# Patient Record
Sex: Female | Born: 1970
Health system: Southern US, Community
[De-identification: ages and names within clinical notes are randomized; demographics above are authoritative.]

## PROBLEM LIST (undated history)

## (undated) DIAGNOSIS — I00 Rheumatic fever without heart involvement: Secondary | ICD-10-CM

## (undated) DIAGNOSIS — T7840XA Allergy, unspecified, initial encounter: Secondary | ICD-10-CM

## (undated) DIAGNOSIS — Z8669 Personal history of other diseases of the nervous system and sense organs: Secondary | ICD-10-CM

## (undated) DIAGNOSIS — R011 Cardiac murmur, unspecified: Secondary | ICD-10-CM

## (undated) DIAGNOSIS — B029 Zoster without complications: Secondary | ICD-10-CM

## (undated) HISTORY — DX: Cardiac murmur, unspecified: R01.1

## (undated) HISTORY — DX: Zoster without complications: B02.9

## (undated) HISTORY — DX: Allergy, unspecified, initial encounter: T78.40XA

## (undated) HISTORY — DX: Personal history of other diseases of the nervous system and sense organs: Z86.69

## (undated) HISTORY — DX: Rheumatic fever without heart involvement: I00

---

## 1996-08-02 HISTORY — PX: BREAST BIOPSY: SHX20

## 2007-08-03 HISTORY — PX: COLONOSCOPY: SHX174

## 2012-08-02 HISTORY — PX: TONSILLECTOMY: SUR1361

## 2013-08-02 HISTORY — PX: CHOLECYSTECTOMY: SHX55

## 2014-08-02 DIAGNOSIS — B029 Zoster without complications: Secondary | ICD-10-CM

## 2014-08-02 HISTORY — DX: Zoster without complications: B02.9

## 2016-02-25 DIAGNOSIS — J309 Allergic rhinitis, unspecified: Secondary | ICD-10-CM | POA: Diagnosis not present

## 2016-02-25 DIAGNOSIS — H66001 Acute suppurative otitis media without spontaneous rupture of ear drum, right ear: Secondary | ICD-10-CM | POA: Diagnosis not present

## 2016-02-25 DIAGNOSIS — H9201 Otalgia, right ear: Secondary | ICD-10-CM | POA: Diagnosis not present

## 2016-11-29 DIAGNOSIS — R202 Paresthesia of skin: Secondary | ICD-10-CM | POA: Diagnosis not present

## 2016-11-29 DIAGNOSIS — H4710 Unspecified papilledema: Secondary | ICD-10-CM | POA: Diagnosis not present

## 2016-11-29 DIAGNOSIS — G43019 Migraine without aura, intractable, without status migrainosus: Secondary | ICD-10-CM | POA: Diagnosis not present

## 2016-11-29 DIAGNOSIS — S060X0A Concussion without loss of consciousness, initial encounter: Secondary | ICD-10-CM | POA: Diagnosis not present

## 2016-11-29 DIAGNOSIS — R200 Anesthesia of skin: Secondary | ICD-10-CM | POA: Diagnosis not present

## 2016-11-30 ENCOUNTER — Other Ambulatory Visit: Payer: Self-pay | Admitting: Neurology

## 2016-11-30 DIAGNOSIS — R42 Dizziness and giddiness: Secondary | ICD-10-CM

## 2016-11-30 DIAGNOSIS — R413 Other amnesia: Secondary | ICD-10-CM

## 2016-11-30 DIAGNOSIS — S060X0A Concussion without loss of consciousness, initial encounter: Secondary | ICD-10-CM

## 2016-12-01 ENCOUNTER — Other Ambulatory Visit: Payer: Self-pay | Admitting: Neurology

## 2016-12-01 DIAGNOSIS — R413 Other amnesia: Secondary | ICD-10-CM

## 2016-12-01 DIAGNOSIS — S060X0A Concussion without loss of consciousness, initial encounter: Secondary | ICD-10-CM

## 2016-12-01 DIAGNOSIS — R42 Dizziness and giddiness: Secondary | ICD-10-CM

## 2016-12-01 DIAGNOSIS — G4459 Other complicated headache syndrome: Secondary | ICD-10-CM

## 2016-12-01 DIAGNOSIS — R2689 Other abnormalities of gait and mobility: Secondary | ICD-10-CM

## 2016-12-08 DIAGNOSIS — G43909 Migraine, unspecified, not intractable, without status migrainosus: Secondary | ICD-10-CM | POA: Insufficient documentation

## 2016-12-08 DIAGNOSIS — G43019 Migraine without aura, intractable, without status migrainosus: Secondary | ICD-10-CM | POA: Insufficient documentation

## 2016-12-10 ENCOUNTER — Ambulatory Visit: Payer: BLUE CROSS/BLUE SHIELD

## 2016-12-17 ENCOUNTER — Ambulatory Visit
Admission: RE | Admit: 2016-12-17 | Discharge: 2016-12-17 | Disposition: A | Discharge status: Home / Self Care | Payer: BLUE CROSS/BLUE SHIELD | Source: Ambulatory Visit | Attending: Neurology | Admitting: Neurology

## 2016-12-17 DIAGNOSIS — S060X0A Concussion without loss of consciousness, initial encounter: Secondary | ICD-10-CM

## 2016-12-17 DIAGNOSIS — R413 Other amnesia: Secondary | ICD-10-CM

## 2016-12-17 DIAGNOSIS — R2689 Other abnormalities of gait and mobility: Secondary | ICD-10-CM

## 2016-12-17 DIAGNOSIS — G4459 Other complicated headache syndrome: Secondary | ICD-10-CM

## 2016-12-17 DIAGNOSIS — R42 Dizziness and giddiness: Secondary | ICD-10-CM

## 2016-12-30 ENCOUNTER — Other Ambulatory Visit: Payer: Self-pay | Admitting: Neurology

## 2016-12-30 DIAGNOSIS — S060X0A Concussion without loss of consciousness, initial encounter: Secondary | ICD-10-CM

## 2016-12-31 DIAGNOSIS — R51 Headache: Secondary | ICD-10-CM | POA: Diagnosis not present

## 2017-01-09 ENCOUNTER — Ambulatory Visit
Admission: RE | Admit: 2017-01-09 | Discharge: 2017-01-09 | Disposition: A | Discharge status: Home / Self Care | Payer: BLUE CROSS/BLUE SHIELD | Source: Ambulatory Visit | Attending: Neurology | Admitting: Neurology

## 2017-01-09 DIAGNOSIS — S060X0A Concussion without loss of consciousness, initial encounter: Secondary | ICD-10-CM

## 2017-02-01 DIAGNOSIS — S060X9A Concussion with loss of consciousness of unspecified duration, initial encounter: Secondary | ICD-10-CM | POA: Diagnosis not present

## 2017-03-11 DIAGNOSIS — E559 Vitamin D deficiency, unspecified: Secondary | ICD-10-CM | POA: Diagnosis not present

## 2017-03-11 DIAGNOSIS — G43019 Migraine without aura, intractable, without status migrainosus: Secondary | ICD-10-CM | POA: Diagnosis not present

## 2017-03-11 DIAGNOSIS — R0683 Snoring: Secondary | ICD-10-CM | POA: Diagnosis not present

## 2017-03-11 DIAGNOSIS — S060X0D Concussion without loss of consciousness, subsequent encounter: Secondary | ICD-10-CM | POA: Diagnosis not present

## 2017-05-11 ENCOUNTER — Encounter: Payer: Self-pay | Admitting: Family Medicine

## 2017-05-11 ENCOUNTER — Ambulatory Visit (INDEPENDENT_AMBULATORY_CARE_PROVIDER_SITE_OTHER): Payer: BLUE CROSS/BLUE SHIELD | Admitting: Family Medicine

## 2017-05-11 VITALS — BP 126/84 | HR 81 | Temp 98.2°F | Ht 63.5 in | Wt 248.8 lb

## 2017-05-11 DIAGNOSIS — Z6841 Body Mass Index (BMI) 40.0 and over, adult: Secondary | ICD-10-CM | POA: Diagnosis not present

## 2017-05-11 DIAGNOSIS — J302 Other seasonal allergic rhinitis: Secondary | ICD-10-CM | POA: Diagnosis not present

## 2017-05-11 DIAGNOSIS — H9319 Tinnitus, unspecified ear: Secondary | ICD-10-CM | POA: Diagnosis not present

## 2017-05-11 DIAGNOSIS — Z7689 Persons encountering health services in other specified circumstances: Secondary | ICD-10-CM | POA: Diagnosis not present

## 2017-05-11 MED ORDER — FLUTICASONE PROPIONATE 50 MCG/ACT NA SUSP: 2.0000 | Freq: Every day | NASAL | 6 refills | Status: AC

## 2017-05-11 NOTE — Patient Instructions (Signed)
It was a pleasure to meet you today! I look forward to partnering with you for your health care needs  Please schedule a complete physical for the sprin

## 2017-05-11 NOTE — Progress Notes (Signed)
Subjective:    Patient ID: Melinda Tucker, female    DOB: October 21, 1970, 46 y.o.   MRN: [id number]  HPI This is a 46 yo female who presents today to establish care. She is married with 4 kids (all in their 55s). Her mother lives with her. 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 She is working on her doctorate in Neurosurgeon.   Has recently started a weight loss plan with exercise. Is walking. Is doing shakeology.   Energy level is good, sleeps well.   Had shingles in left eye two years ago. Doesn't get sick often. Gets migraines a couple of times of month.   Has had foot pain x 1 month, all over, when she stands up. Feels better when she wears shoes.   Had a concussion last year, had ringing in her ears which resolved but has returned. Has had some sinus issues with pressure under left eye. Takes zyrtec seasonally.   Had low calcium at 9.7. Did 50,000 IU weekly supplementation.    Past Medical History:  Diagnosis Date  . Allergy   . Heart murmur   . Hx of migraine headaches Started at age 60  . Rheumatic fever    At 15-years-ols  . Shingles 2016   Past Surgical History:  Procedure Laterality Date  . BREAST BIOPSY Left 1998  . CHOLECYSTECTOMY  2015  . COLONOSCOPY  2009  . TONSILLECTOMY  2014   Family History  Problem Relation Age of Onset  . Asthma Mother   . Depression Mother   . Hearing loss Mother   . Stroke Mother   . Arthritis Father   . Cancer Father   . Early death Father   . Hearing loss Father   . Hypertension Father   . Stroke Father   . Miscarriages / Stillbirths Sister   . Alcohol abuse Brother   . Mental illness Brother   . Learning disabilities Daughter   . Learning disabilities Son   . Arthritis Maternal Grandmother   . Cancer Maternal Grandmother   . Hearing loss Maternal Grandmother   . Heart attack Maternal Grandmother   . Kidney disease Maternal Grandmother   . Stroke Maternal  Grandmother   . Alcohol abuse Maternal Grandfather   . Early death Maternal Grandfather   . Alcohol abuse Paternal Grandmother   . Arthritis Paternal Grandmother   . Cancer Paternal Grandmother   . COPD Paternal Grandmother   . Early death Paternal Grandmother   . Heart disease Paternal Grandmother   . Stroke Paternal Grandmother   . Alcohol abuse Paternal Grandfather   . Arthritis Paternal Grandfather   . Cancer Paternal Grandfather   . Early death Paternal Grandfather   . Hypertension Paternal Grandfather    Social History  Substance Use Topics  . Smoking status: Not on file  . Smokeless tobacco: Not on file  . Alcohol use Not on file        Review of Systems  Constitutional: Negative for fatigue, fever and unexpected weight change.  Respiratory: Negative for cough, chest tightness and shortness of breath.   Cardiovascular: Negative for chest pain, palpitations and leg swelling.  Gastrointestinal: Negative for abdominal pain, constipation, diarrhea, nausea and vomiting.  Genitourinary: Negative for difficulty urinating and dysuria.  Musculoskeletal: Negative.   Allergic/Immunologic: Positive for environmental allergies (seasonal).  Neurological: Negative for dizziness, light-headedness and headaches.  Psychiatric/Behavioral: Negative for dysphoric mood and sleep disturbance. The  patient is not nervous/anxious.        Objective:   Physical Exam Physical Exam  Constitutional: Oriented to person, place, and time.She appears well-developed and well-nourished.  HENT:  Head: Normocephalic and atraumatic.  Eyes: Conjunctivae are normal.  Neck: Normal range of motion. Neck supple.  Cardiovascular: Normal rate, regular rhythm and normal heart sounds.   Pulmonary/Chest: Effort normal and breath sounds normal.  Musculoskeletal: Normal range of motion.  Neurological: Alert and oriented to person, place, and time.  Skin: Skin is warm and dry.  Psychiatric: Normal mood and  affect. Behavior is normal. Judgment and thought content normal.  Vitals reviewed.     BP 126/84 (BP Location: Right Arm, Patient Position: Sitting, Cuff Size: Large)   Pulse 81   Temp 98.2 F (36.8 C) (Oral)   Ht 5' 3.5" (1.613 m)   Wt 248 lb 12.8 oz (112.9 kg)   SpO2 97%   BMI 43.38 kg/m      Assessment & Plan:  1. Encounter to establish care - Discussed and encouraged healthy lifestyle choices- adequate sleep, regular exercise, stress management and healthy food choices.  - reviewed labs from 4/18 on Care Everywhere, no need for further labs today  2. Seasonal allergic rhinitis, unspecified trigger - fluticasone (FLONASE) 50 MCG/ACT nasal spray; Place 2 sprays into both nostrils daily.  Dispense: 16 g; Refill: 6  3. Class 3 severe obesity due to excess calories without serious comorbidity with body mass index (BMI) of 40.0 to 44.9 in adult [facility]) - encouraged continued weight loss efforts, regular exercise, healthy food choices  4. Tinnitus - if no improvement with treating allergic rhinitis, she will request follow up with neurology    Clarene Reamer, FNP-BC  [facility], [place]  05/16/2017 10:21 PM

## 2017-05-16 ENCOUNTER — Encounter: Payer: Self-pay | Admitting: Family Medicine

## 2017-11-11 ENCOUNTER — Ambulatory Visit (INDEPENDENT_AMBULATORY_CARE_PROVIDER_SITE_OTHER): Payer: BLUE CROSS/BLUE SHIELD | Admitting: Primary Care

## 2017-11-11 ENCOUNTER — Ambulatory Visit (INDEPENDENT_AMBULATORY_CARE_PROVIDER_SITE_OTHER)
Admission: RE | Admit: 2017-11-11 | Discharge: 2017-11-11 | Disposition: A | Discharge status: Home / Self Care | Payer: BLUE CROSS/BLUE SHIELD | Source: Ambulatory Visit | Attending: Primary Care | Admitting: Primary Care

## 2017-11-11 VITALS — BP 122/72 | HR 78 | Temp 98.0°F | Ht 63.5 in | Wt 241.0 lb

## 2017-11-11 DIAGNOSIS — Z0001 Encounter for general adult medical examination with abnormal findings: Secondary | ICD-10-CM

## 2017-11-11 DIAGNOSIS — Z1239 Encounter for other screening for malignant neoplasm of breast: Secondary | ICD-10-CM

## 2017-11-11 DIAGNOSIS — G8929 Other chronic pain: Secondary | ICD-10-CM | POA: Diagnosis not present

## 2017-11-11 DIAGNOSIS — G43019 Migraine without aura, intractable, without status migrainosus: Secondary | ICD-10-CM | POA: Diagnosis not present

## 2017-11-11 DIAGNOSIS — M79671 Pain in right foot: Secondary | ICD-10-CM

## 2017-11-11 DIAGNOSIS — D229 Melanocytic nevi, unspecified: Secondary | ICD-10-CM

## 2017-11-11 DIAGNOSIS — M7731 Calcaneal spur, right foot: Secondary | ICD-10-CM | POA: Diagnosis not present

## 2017-11-11 DIAGNOSIS — Z0000 Encounter for general adult medical examination without abnormal findings: Secondary | ICD-10-CM

## 2017-11-11 DIAGNOSIS — Z1231 Encounter for screening mammogram for malignant neoplasm of breast: Secondary | ICD-10-CM

## 2017-11-11 HISTORY — DX: Other chronic pain: G89.29

## 2017-11-11 LAB — COMPREHENSIVE METABOLIC PANEL
ALBUMIN: 4.2 g/dL (ref 3.5–5.2)
ALT: 26 U/L (ref 0–35)
AST: 19 U/L (ref 0–37)
Alkaline Phosphatase: 88 U/L (ref 39–117)
BILIRUBIN TOTAL: 0.5 mg/dL (ref 0.2–1.2)
BUN: 12 mg/dL (ref 6–23)
CALCIUM: 9.5 mg/dL (ref 8.4–10.5)
CHLORIDE: 104 meq/L (ref 96–112)
CO2: 29 mEq/L (ref 19–32)
CREATININE: 0.8 mg/dL (ref 0.40–1.20)
GFR: 81.16 mL/min (ref >60.00)
Glucose, Bld: 92 mg/dL (ref 70–99)
Potassium: 4.3 mEq/L (ref 3.5–5.1)
SODIUM: 139 meq/L (ref 135–145)
TOTAL PROTEIN: 7.5 g/dL (ref 6.0–8.3)

## 2017-11-11 LAB — LIPID PANEL
CHOLESTEROL: 172 mg/dL (ref 0–200)
HDL: 49.6 mg/dL (ref >39.00)
LDL Cholesterol: 101 mg/dL — ABNORMAL HIGH (ref 0–99)
NonHDL: 122
TRIGLYCERIDES: 106 mg/dL (ref 0.0–149.0)
Total CHOL/HDL Ratio: 3
VLDL: 21.2 mg/dL (ref 0.0–40.0)

## 2017-11-11 NOTE — Progress Notes (Signed)
Subjective:    Patient ID: Melinda Tucker, female    DOB: 1971/03/07, 47 y.o.   MRN: [id number]  HPI  Melinda Tucker is a 47 year old female who presents today for complete physical.  She's noticed foot pain that is located to the right lateral foot for the past 3-4 months. Her pain is daily, worse when standing after sitting for prolonged periods of time. She denies trauma/injury. She wears tennis shoes mostly. She's taken Motrin intermittently with improvement.   She is also concerned amount numerous nevi that have presented over the last several months. She has a red spot to her left cheek that doesn't look like any of the others. She does not see dermatology.   Immunizations: -Tetanus: Completed in 2012 -Influenza: Did not complete last season  Diet: She endorses a healthy diet. Breakfast: Oatmeal, protein bar Lunch: Salad Dinner: Meat, vegetables, starch Snacks: Nuts, dried fruit Desserts: 2-3 times weekly Beverages: Water, un-sweet tea, occasional alcohol  Exercise: She walks during lunch, 1 mile, 4 days weekly Eye exam: Completed 11 months ago. Dental exam: Completes annually  Pap Smear: Hysterectomy  Mammogram: Completed years ago    Review of Systems  Constitutional: Negative for unexpected weight change.  HENT: Negative for rhinorrhea.   Respiratory: Negative for cough and shortness of breath.   Cardiovascular: Negative for chest pain.  Gastrointestinal: Negative for constipation and diarrhea.  Genitourinary: Negative for difficulty urinating and menstrual problem.  Musculoskeletal: Positive for arthralgias. Negative for myalgias.       Right foot pain  Skin: Negative for rash.       Numerous nevi  Allergic/Immunologic: Negative for environmental allergies.  Neurological: Negative for dizziness, numbness and headaches.  Psychiatric/Behavioral: The patient is not nervous/anxious.        Past Medical History:  Diagnosis Date  . Allergy   . Heart murmur   . Hx of  migraine headaches Started at age 29  . Rheumatic fever    At 15-years-ols  . Shingles 2016     Social History   Socioeconomic History  . Marital status: Married    Spouse name: Not on file  . Number of children: Not on file  . Years of education: Not on file  . Highest education level: Not on file  Occupational History  . Not on file  Social Needs  . Financial resource strain: Not on file  . Food insecurity:    Worry: Not on file    Inability: Not on file  . Transportation needs:    Medical: Not on file    Non-medical: Not on file  Tobacco Use  . Smoking status: Never Smoker  . Smokeless tobacco: Never Used  Substance and Sexual Activity  . Alcohol use: Not on file  . Drug use: Not on file  . Sexual activity: Not on file  Lifestyle  . Physical activity:    Days per week: Not on file    Minutes per session: Not on file  . Stress: Not on file  Relationships  . Social connections:    Talks on phone: Not on file    Gets together: Not on file    Attends religious service: Not on file    Active member of club or organization: Not on file    Attends meetings of clubs or organizations: Not on file    Relationship status: Not on file  . Intimate partner violence:    Fear of current or ex partner: Not on file  Emotionally abused: Not on file    Physically abused: Not on file    Forced sexual activity: Not on file  Other Topics Concern  . Not on file  Social History Narrative  . Not on file     Family History  Problem Relation Age of Onset  . Asthma Mother   . Depression Mother   . Hearing loss Mother   . Stroke Mother   . Arthritis Father   . Cancer Father   . Early death Father   . Hearing loss Father   . Hypertension Father   . Stroke Father   . Miscarriages / Stillbirths Sister   . Alcohol abuse Brother   . Mental illness Brother   . Learning disabilities Daughter   . Learning disabilities Son   . Arthritis Maternal Grandmother   . Cancer Maternal  Grandmother   . Hearing loss Maternal Grandmother   . Heart attack Maternal Grandmother   . Kidney disease Maternal Grandmother   . Stroke Maternal Grandmother   . Alcohol abuse Maternal Grandfather   . Early death Maternal Grandfather   . Alcohol abuse Paternal Grandmother   . Arthritis Paternal Grandmother   . Cancer Paternal Grandmother   . COPD Paternal Grandmother   . Early death Paternal Grandmother   . Heart disease Paternal Grandmother   . Stroke Paternal Grandmother   . Alcohol abuse Paternal Grandfather   . Arthritis Paternal Grandfather   . Cancer Paternal Grandfather   . Early death Paternal Grandfather   . Hypertension Paternal Grandfather     Allergies  Allergen Reactions  . Codeine Anaphylaxis  . Shellfish Allergy Anaphylaxis  . Camphor Hives  . Phenol Hives  . Sulfa Antibiotics Rash    Current Outpatient Medications on File Prior to Visit  Medication Sig Dispense Refill  . fluticasone (FLONASE) 50 MCG/ACT nasal spray Place 2 sprays into both nostrils daily. 16 g 6  . rizatriptan (MAXALT) 10 MG tablet Take by mouth.     No current facility-administered medications on file prior to visit.     BP 122/72   Pulse 78   Temp 98 F (36.7 C) (Oral)   Ht 5' 3.5" (1.613 m)   Wt 241 lb (109.3 kg)   SpO2 98%   BMI 42.02 kg/m    Objective:   Physical Exam  Constitutional: She is oriented to person, place, and time. She appears well-nourished.  HENT:  Right Ear: Tympanic membrane and ear canal normal.  Left Ear: Tympanic membrane and ear canal normal.  Nose: Nose normal.  Mouth/Throat: Oropharynx is clear and moist.  Eyes: Pupils are equal, round, and reactive to light. Conjunctivae and EOM are normal.  Neck: Neck supple. No thyromegaly present.  Cardiovascular: Normal rate and regular rhythm.  No murmur heard. Pulmonary/Chest: Effort normal and breath sounds normal. She has no rales.  Abdominal: Soft. Bowel sounds are normal. There is no tenderness.    Musculoskeletal: Normal range of motion.       Right foot: There is tenderness. There is normal range of motion, no swelling and no deformity.       Feet:  Lymphadenopathy:    She has no cervical adenopathy.  Neurological: She is alert and oriented to person, place, and time. She has normal reflexes. No cranial nerve deficit.  Skin: Skin is warm and dry. No rash noted.  Numerous benign appearing nevi to upper extremities, anterior and posterior trunk. Small red circular lesion to left cheek.  Psychiatric: She  has a normal mood and affect.          Assessment & Plan:

## 2017-11-11 NOTE — Patient Instructions (Addendum)
Stop by the lab and xray prior to leaving today. I will notify you of your results once received.   Call the [facility] to schedule your mammogram.  Start exercising. You should be getting 150 minutes of moderate intensity exercise weekly.  Continue to work on a healthy diet.  Start Motrin 600 mg 2-3 times daily for one week for foot pain.  Ice the foot when possible. Try exercises and stretching daily for at least one week as discussed.  Stop by the front desk and speak with either Rosaria Ferries regarding your referral to Dermatology.   Follow up in 1 year for your annual exam or sooner if needed.  It was a pleasure to see you today!   Preventive Care 40-64 Years, Female Preventive care refers to lifestyle choices and visits with your health care provider that can promote health and wellness. What does preventive care include?  A yearly physical exam. This is also called an annual well check.  Dental exams once or twice a year.  Routine eye exams. Ask your health care provider how often you should have your eyes checked.  Personal lifestyle choices, including: ? Daily care of your teeth and gums. ? Regular physical activity. ? Eating a healthy diet. ? Avoiding tobacco and drug use. ? Limiting alcohol use. ? Practicing safe sex. ? Taking low-dose aspirin daily starting at age 44. ? Taking vitamin and mineral supplements as recommended by your health care provider. What happens during an annual well check? The services and screenings done by your health care provider during your annual well check will depend on your age, overall health, lifestyle risk factors, and family history of disease. Counseling Your health care provider may ask you questions about your:  Alcohol use.  Tobacco use.  Drug use.  Emotional well-being.  Home and relationship well-being.  Sexual activity.  Eating habits.  Work and work Statistician.  Method of birth control.  Menstrual  cycle.  Pregnancy history.  Screening You may have the following tests or measurements:  Height, weight, and BMI.  Blood pressure.  Lipid and cholesterol levels. These may be checked every 5 years, or more frequently if you are over 68 years old.  Skin check.  Lung cancer screening. You may have this screening every year starting at age 31 if you have a 30-pack-year history of smoking and currently smoke or have quit within the past 15 years.  Fecal occult blood test (FOBT) of the stool. You may have this test every year starting at age 28.  Flexible sigmoidoscopy or colonoscopy. You may have a sigmoidoscopy every 5 years or a colonoscopy every 10 years starting at age 16.  Hepatitis C blood test.  Hepatitis B blood test.  Sexually transmitted disease (STD) testing.  Diabetes screening. This is done by checking your blood sugar (glucose) after you have not eaten for a while (fasting). You may have this done every 1-3 years.  Mammogram. This may be done every 1-2 years. Talk to your health care provider about when you should start having regular mammograms. This may depend on whether you have a family history of breast cancer.  BRCA-related cancer screening. This may be done if you have a family history of breast, ovarian, tubal, or peritoneal cancers.  Pelvic exam and Pap test. This may be done every 3 years starting at age 61. Starting at age 92, this may be done every 5 years if you have a Pap test in combination with an HPV  test.  Bone density scan. This is done to screen for osteoporosis. You may have this scan if you are at high risk for osteoporosis.  Discuss your test results, treatment options, and if necessary, the need for more tests with your health care provider. Vaccines Your health care provider may recommend certain vaccines, such as:  Influenza vaccine. This is recommended every year.  Tetanus, diphtheria, and acellular pertussis (Tdap, Td) vaccine. You may  need a Td booster every 10 years.  Varicella vaccine. You may need this if you have not been vaccinated.  Zoster vaccine. You may need this after age 64.  Measles, mumps, and rubella (MMR) vaccine. You may need at least one dose of MMR if you were born in 1957 or later. You may also need a second dose.  Pneumococcal 13-valent conjugate (PCV13) vaccine. You may need this if you have certain conditions and were not previously vaccinated.  Pneumococcal polysaccharide (PPSV23) vaccine. You may need one or two doses if you smoke cigarettes or if you have certain conditions.  Meningococcal vaccine. You may need this if you have certain conditions.  Hepatitis A vaccine. You may need this if you have certain conditions or if you travel or work in places where you may be exposed to hepatitis A.  Hepatitis B vaccine. You may need this if you have certain conditions or if you travel or work in places where you may be exposed to hepatitis B.  Haemophilus influenzae type b (Hib) vaccine. You may need this if you have certain conditions.  Talk to your health care provider about which screenings and vaccines you need and how often you need them. This information is not intended to replace advice given to you by your health care provider. Make sure you discuss any questions you have with your health care provider. Document Released: 08/15/2015 Document Revised: 04/07/2016 Document Reviewed: 05/20/2015 Elsevier Interactive Patient Education  [employer].

## 2017-11-11 NOTE — Assessment & Plan Note (Signed)
Widespread to upper extremities, bilateral, also to anterior and posterior trunk. Most appear benign. Questionable lesion to left cheek. Referral placed to dermatology for further evaluation.

## 2017-11-11 NOTE — Assessment & Plan Note (Signed)
To right medial foot x 3-4 months. Suspect this is secondary to strain from renovation of her home. Recommended stretching/exercises, rest, Motrin daily x 1 week. Check plain films today. No obvious deformity.

## 2017-11-11 NOTE — Assessment & Plan Note (Signed)
Td UTD. Mammogram due, orders placed. Recommended regular exercise, continue to work on diet. Exam unremarkable. Labs pending. Follow up in 1 year for CPE.

## 2017-11-11 NOTE — Assessment & Plan Note (Signed)
Well managed. Doing Flonase to prevent migraines. Using Maxalt once every 6 weeks on average.

## 2017-11-14 ENCOUNTER — Other Ambulatory Visit: Payer: Self-pay | Admitting: Primary Care

## 2017-11-14 ENCOUNTER — Encounter: Payer: Self-pay | Admitting: *Deleted

## 2017-11-14 DIAGNOSIS — M7731 Calcaneal spur, right foot: Secondary | ICD-10-CM

## 2017-12-06 ENCOUNTER — Ambulatory Visit: Payer: BLUE CROSS/BLUE SHIELD | Admitting: Podiatry

## 2017-12-06 ENCOUNTER — Encounter: Payer: Self-pay | Admitting: Podiatry

## 2017-12-06 DIAGNOSIS — G5763 Lesion of plantar nerve, bilateral lower limbs: Secondary | ICD-10-CM | POA: Diagnosis not present

## 2017-12-06 DIAGNOSIS — M7751 Other enthesopathy of right foot: Secondary | ICD-10-CM

## 2017-12-06 MED ORDER — MELOXICAM 15 MG PO TABS: 15.0000 mg | ORAL_TABLET | Freq: Every day | ORAL | 1 refills | Status: DC

## 2017-12-08 NOTE — Progress Notes (Signed)
   HPI: 47 year old female presenting today as a new patient with a chief complaint of pain to the arch and 5th metatarsal of the right foot that began 6 months ago. Walking increases this pain. She has not done anything to treat the symptoms. Patient is here for further evaluation and treatment.   Past Medical History:  Diagnosis Date  . Allergy   . Heart murmur   . Hx of migraine headaches Started at age 43  . Rheumatic fever    At 15-years-ols  . Shingles 2016     Physical Exam: General: The patient is alert and oriented x3 in no acute distress.  Dermatology: Skin is warm, dry and supple bilateral lower extremities. Negative for open lesions or macerations.  Vascular: Palpable pedal pulses bilaterally. No edema or erythema noted. Capillary refill within normal limits.  Neurological: Epicritic and protective threshold grossly intact bilaterally.   Musculoskeletal Exam: Sharp pain with palpation of the 4th interspace and lateral compression of the metatarsal heads consistent with neuroma.  Positive Conley Canal sign with loadbearing of the forefoot. Pain with palpation to the 5th MPJ of the right foot.   Assessment: 1.  Morton's neuroma 4th interspace bilateral feet, right greater than left 2. 5th MPJ capsulitis right    Plan of Care:  1. Patient was evaluated. X-Ray in Epic reviewed.  2. Injection of 0.5 mLs Celestone Soluspan injected into the Morton's neuroma of the right foot.  3. Injection of 0.5 mLs Celestone Soluspan injected into the 5th MPJ of the right foot.  4. Prescription for Meloxicam provided to patient.  5. Met pads dispensed.  6. Return to clinic in 4 weeks.    Edrick Kins, DPM [facility]  Dr. Edrick Kins, [address]                                        [place], [place] [address]                Office [phone]  Fax [phone]

## 2017-12-12 ENCOUNTER — Encounter: Payer: Self-pay | Admitting: Podiatry

## 2017-12-21 DIAGNOSIS — D2262 Melanocytic nevi of left upper limb, including shoulder: Secondary | ICD-10-CM | POA: Diagnosis not present

## 2017-12-21 DIAGNOSIS — Z1283 Encounter for screening for malignant neoplasm of skin: Secondary | ICD-10-CM | POA: Diagnosis not present

## 2017-12-21 DIAGNOSIS — D485 Neoplasm of uncertain behavior of skin: Secondary | ICD-10-CM | POA: Diagnosis not present

## 2017-12-21 DIAGNOSIS — L812 Freckles: Secondary | ICD-10-CM | POA: Diagnosis not present

## 2017-12-21 DIAGNOSIS — B078 Other viral warts: Secondary | ICD-10-CM | POA: Diagnosis not present

## 2017-12-21 DIAGNOSIS — D225 Melanocytic nevi of trunk: Secondary | ICD-10-CM | POA: Diagnosis not present

## 2017-12-21 DIAGNOSIS — L918 Other hypertrophic disorders of the skin: Secondary | ICD-10-CM | POA: Diagnosis not present

## 2017-12-21 DIAGNOSIS — L820 Inflamed seborrheic keratosis: Secondary | ICD-10-CM | POA: Diagnosis not present

## 2018-01-03 ENCOUNTER — Encounter: Payer: Self-pay | Admitting: Podiatry

## 2018-01-03 ENCOUNTER — Ambulatory Visit: Payer: BLUE CROSS/BLUE SHIELD | Admitting: Podiatry

## 2018-01-03 DIAGNOSIS — M7751 Other enthesopathy of right foot: Secondary | ICD-10-CM

## 2018-01-03 DIAGNOSIS — G5763 Lesion of plantar nerve, bilateral lower limbs: Secondary | ICD-10-CM

## 2018-01-03 DIAGNOSIS — M779 Enthesopathy, unspecified: Secondary | ICD-10-CM | POA: Diagnosis not present

## 2018-01-05 NOTE — Progress Notes (Signed)
   HPI: 46 year old female presenting today for follow up evaluation of a Morton's neuroma of bilateral feet and 5th MPJ capsulitis of the right foot. She states her pain has improved significantly. She reports some mild numbness but denies any pain. There are no modifying factors noted. Patient is here for further evaluation and treatment.   Past Medical History:  Diagnosis Date  . Allergy   . Heart murmur   . Hx of migraine headaches Started at age 38  . Rheumatic fever    At 15-years-ols  . Shingles 2016     Objective: Physical Exam General: The patient is alert and oriented x3 in no acute distress.  Dermatology: Skin is cool, dry and supple bilateral lower extremities. Negative for open lesions or macerations.  Vascular: Palpable pedal pulses bilaterally. No edema or erythema noted. Capillary refill within normal limits.  Neurological: Epicritic and protective threshold grossly intact bilaterally.   Musculoskeletal Exam: All pedal and ankle joints range of motion within normal limits bilateral. Muscle strength 5/5 in all groups bilateral.    Assessment: 1. Morton's neuroma 4th interspace bilateral feet, right greater than left - resolved 2. 5th MPJ capsulitis right - resolved    Plan of Care:  1. Patient was evaluated.  2. Recommended good shoe gear.  3. Discontinue taking Meloxicam due to side effects.  4. Return to clinic as needed.     Edrick Kins, DPM [facility]  Dr. Edrick Kins, [place]                                        [place], [place] [address]                Office [phone]  Fax [phone]

## 2018-01-10 ENCOUNTER — Telehealth: Payer: BLUE CROSS/BLUE SHIELD | Admitting: Nurse Practitioner

## 2018-01-10 DIAGNOSIS — R059 Cough, unspecified: Secondary | ICD-10-CM

## 2018-01-10 DIAGNOSIS — R05 Cough: Secondary | ICD-10-CM

## 2018-01-10 MED ORDER — BENZONATATE 100 MG PO CAPS: 100.0000 mg | ORAL_CAPSULE | Freq: Three times a day (TID) | ORAL | 0 refills | Status: DC | PRN

## 2018-01-10 NOTE — Progress Notes (Signed)

## 2018-01-12 MED ORDER — PREDNISONE 10 MG (21) PO TBPK
ORAL_TABLET | ORAL | 0 refills | Status: DC
Start: 2018-01-12 — End: 2018-02-17

## 2018-01-12 NOTE — Addendum Note (Signed)
Addended by: Chevis Pretty on: 01/12/2018 05:02 PM   Modules accepted: Orders

## 2018-01-16 ENCOUNTER — Ambulatory Visit: Payer: Self-pay | Admitting: *Deleted

## 2018-01-16 DIAGNOSIS — J22 Unspecified acute lower respiratory infection: Secondary | ICD-10-CM | POA: Diagnosis not present

## 2018-01-16 DIAGNOSIS — R05 Cough: Secondary | ICD-10-CM | POA: Diagnosis not present

## 2018-01-16 NOTE — Telephone Encounter (Signed)
Pt reports productive cough, onset 01/11/18. Coughing up "Yellowish mucous." Audible wheezing, inspiratory and expiratory. States SOB with coughing. Did E-Visit 6/13, "Viral bronchitis" and started on prednisone, day 5 of 6. Also RX Tessalon perles, ineffective. Reports intermittent fever, Max 101.4 Saturday, has not checked temp, this AM. States cough worsening, can not lie down, awake most of night. Also reports "I usually have episode of bronchitis once a year but this seems different."  Pt directed to UC. States will follow disposition. Care advise given per protocol. Reason for Disposition . Wheezing is present  Answer Assessment - Initial Assessment Questions 1. ONSET: "When did the cough begin?"      01/11/18 2. SEVERITY: "How bad is the cough today?"      severe 3. RESPIRATORY DISTRESS: "Describe your breathing."      SOB with coughing, wheezing present.  Audible during call. 4. FEVER: "Do you have a fever?" If so, ask: "What is your temperature, how was it measured, and when did it start?"     At times , MAX 101.4, Saturday 5. SPUTUM: "Describe the color of your sputum" (clear, white, yellow, green)     Yellowish 6. HEMOPTYSIS: "Are you coughing up any blood?" If so ask: "How much?" (flecks, streaks, tablespoons, etc.)     no 7. CARDIAC HISTORY: "Do you have any history of heart disease?" (e.g., heart attack, congestive heart failure)      no 8. LUNG HISTORY: "Do you have any history of lung disease?"  (e.g., pulmonary embolus, asthma, emphysema)     no 9. PE RISK FACTORS: "Do you have a history of blood clots?" (or: recent major surgery, recent prolonged travel, bedridden )     no 10. OTHER SYMPTOMS: "Do you have any other symptoms?" (e.g., runny nose, wheezing, chest pain) Fever, achy at times  Protocols used: [place]

## 2018-01-16 NOTE — Telephone Encounter (Signed)
Patient called back. Patient did went to urgent care, she was there all day. She was diagnosis with bacterial pneumonia. Patient is at the pharmacy now and picking up her Rx. Patient will update later.

## 2018-01-16 NOTE — Telephone Encounter (Signed)
Noted.  Please follow up with patient, did she go to Urgent Care?  What was the diagnosis? If she didn't go to UC then I'm happy to see her.

## 2018-01-16 NOTE — Telephone Encounter (Signed)
Message left for patient to return my call.  

## 2018-01-16 NOTE — Telephone Encounter (Signed)
Noted  

## 2018-02-08 ENCOUNTER — Telehealth: Payer: Self-pay | Admitting: *Deleted

## 2018-02-08 NOTE — Telephone Encounter (Signed)
Refill request for meloxicam. Reviewed LOV and Meloxicam had been discontinued due to side effects. Return fax denying.

## 2018-02-12 ENCOUNTER — Other Ambulatory Visit: Payer: Self-pay | Admitting: Nurse Practitioner

## 2018-02-13 ENCOUNTER — Encounter: Payer: Self-pay | Admitting: Primary Care

## 2018-02-15 MED ORDER — BENZONATATE 100 MG PO CAPS: 100.0000 mg | ORAL_CAPSULE | Freq: Three times a day (TID) | ORAL | 0 refills | Status: DC | PRN

## 2018-02-17 ENCOUNTER — Ambulatory Visit: Payer: BLUE CROSS/BLUE SHIELD | Admitting: Primary Care

## 2018-02-17 ENCOUNTER — Encounter: Payer: Self-pay | Admitting: Primary Care

## 2018-02-17 VITALS — BP 120/70 | HR 91 | Temp 98.2°F | Ht 63.5 in | Wt 242.2 lb

## 2018-02-17 DIAGNOSIS — R059 Cough, unspecified: Secondary | ICD-10-CM

## 2018-02-17 DIAGNOSIS — R05 Cough: Secondary | ICD-10-CM

## 2018-02-17 MED ORDER — BENZONATATE 200 MG PO CAPS: 200.0000 mg | ORAL_CAPSULE | Freq: Three times a day (TID) | ORAL | 0 refills | Status: DC | PRN

## 2018-02-17 NOTE — Progress Notes (Signed)
Subjective:    Patient ID: Melinda Tucker, female    DOB: 1970-10-18, 47 y.o.   MRN: [id number]  HPI  Melinda Tucker is a 47 year old female who presents today with a chief complaint of cough.   Her cough began 7 weeks ago with initial symptoms of fatigue, cough, fevers, headaches.  Her cough began June 8th, 2019. She was evaluated through an e-visit on 01/10/18 and was diagnosed with viral bronchitis. She was treated with Tessalon Perles 100 mg. She then reached back out to the treating provider on 01/12/18 for continued cough and was then treated with prednisone course for continued cough.  She then presented to [facility] through [facility] on 01/16/18. She underwent CBC which showed leukocytosis, also underwent chest xray which was negative for pneumonia. She was treated with azithromycin, Bromfed DM, albuterol inhaler and told to follow up if no improvement.   She sent a message through our online portal on 02/13/18 with reports of continued cough that was productive.   Now she's feeling less fatigued but is still napping once daily. Her cough is overall better, less frequent, but continues to experience coughing spells. She also reports shortness of breath with exertion, wheezing. She has to take the elevated at work rather than using the stairs. She's not checked her temperature recently.   She's using her albuterol inhaler once daily with temporary improvement in shortness of breath. She's taken Zyrtec intermittently for allergy symptoms. She was provided with a refill for Tessalon Perles 100 mg for which she's not yet picked up.   She denies a history of asthma, esophageal burning/belching/reflux.   Review of Systems  Constitutional: Positive for fatigue. Negative for chills.  HENT: Positive for postnasal drip. Negative for congestion and sore throat.   Respiratory: Positive for cough, shortness of breath and wheezing.   Cardiovascular: Negative for chest pain.    Allergic/Immunologic: Positive for environmental allergies.       Past Medical History:  Diagnosis Date  . Allergy   . Heart murmur   . Hx of migraine headaches Started at age 8  . Rheumatic fever    At 15-years-ols  . Shingles 2016     Social History   Socioeconomic History  . Marital status: Married    Spouse name: Not on file  . Number of children: Not on file  . Years of education: Not on file  . Highest education level: Not on file  Occupational History  . Not on file  Social Needs  . Financial resource strain: Not on file  . Food insecurity:    Worry: Not on file    Inability: Not on file  . Transportation needs:    Medical: Not on file    Non-medical: Not on file  Tobacco Use  . Smoking status: Never Smoker  . Smokeless tobacco: Never Used  Substance and Sexual Activity  . Alcohol use: Not on file  . Drug use: Not on file  . Sexual activity: Not on file  Lifestyle  . Physical activity:    Days per week: Not on file    Minutes per session: Not on file  . Stress: Not on file  Relationships  . Social connections:    Talks on phone: Not on file    Gets together: Not on file    Attends religious service: Not on file    Active member of club or organization: Not on file    Attends meetings of clubs or  organizations: Not on file    Relationship status: Not on file  . Intimate partner violence:    Fear of current or ex partner: Not on file    Emotionally abused: Not on file    Physically abused: Not on file    Forced sexual activity: Not on file  Other Topics Concern  . Not on file  Social History Narrative  . Not on file     Family History  Problem Relation Age of Onset  . Asthma Mother   . Depression Mother   . Hearing loss Mother   . Stroke Mother   . Arthritis Father   . Cancer Father   . Early death Father   . Hearing loss Father   . Hypertension Father   . Stroke Father   . Miscarriages / Stillbirths Sister   . Alcohol abuse Brother    . Mental illness Brother   . Learning disabilities Daughter   . Learning disabilities Son   . Arthritis Maternal Grandmother   . Cancer Maternal Grandmother   . Hearing loss Maternal Grandmother   . Heart attack Maternal Grandmother   . Kidney disease Maternal Grandmother   . Stroke Maternal Grandmother   . Alcohol abuse Maternal Grandfather   . Early death Maternal Grandfather   . Alcohol abuse Paternal Grandmother   . Arthritis Paternal Grandmother   . Cancer Paternal Grandmother   . COPD Paternal Grandmother   . Early death Paternal Grandmother   . Heart disease Paternal Grandmother   . Stroke Paternal Grandmother   . Alcohol abuse Paternal Grandfather   . Arthritis Paternal Grandfather   . Cancer Paternal Grandfather   . Early death Paternal Grandfather   . Hypertension Paternal Grandfather     Allergies  Allergen Reactions  . Codeine Anaphylaxis  . Shellfish Allergy Anaphylaxis  . Camphor Hives  . Meloxicam Other (See Comments)  . Phenol Hives  . Sulfa Antibiotics Rash    Current Outpatient Medications on File Prior to Visit  Medication Sig Dispense Refill  . meloxicam (MOBIC) 15 MG tablet Take 1 tablet (15 mg total) by mouth daily. 30 tablet 1  . rizatriptan (MAXALT) 10 MG tablet Take by mouth.    . fluticasone (FLONASE) 50 MCG/ACT nasal spray Place 2 sprays into both nostrils daily. (Patient not taking: Reported on 02/17/2018) 16 g 6   No current facility-administered medications on file prior to visit.     BP 120/70   Pulse 91   Temp 98.2 F (36.8 C) (Oral)   Ht 5' 3.5" (1.613 m)   Wt 242 lb 4 oz (109.9 kg)   SpO2 95%   BMI 42.24 kg/m    Objective:   Physical Exam  Constitutional: She appears well-nourished. She does not appear ill.  HENT:  Right Ear: Tympanic membrane and ear canal normal.  Left Ear: Tympanic membrane and ear canal normal.  Nose: No mucosal edema. Right sinus exhibits no maxillary sinus tenderness and no frontal sinus  tenderness. Left sinus exhibits no maxillary sinus tenderness and no frontal sinus tenderness.  Mouth/Throat: Oropharynx is clear and moist.  Neck: Neck supple.  Cardiovascular: Normal rate and regular rhythm.  Respiratory: Effort normal and breath sounds normal. She has no wheezes.  Deep congested cough noted during visit  Skin: Skin is warm and dry.           Assessment & Plan:  Post Infectious Cough:  Treated for bronchitis with antibiotics, cough overall better, persists. Exam today  with clear lungs, doesn't appear sickly, normal vitals. Suspect post infectious cough given exam and lack of other symptoms. Discussed nature of post infectious cough. Will treat with Tessalon Perles 200 mg, Zyrtec daily, albuterol PRN. She will update in 1 week.  Pleas Koch, NP

## 2018-02-17 NOTE — Patient Instructions (Signed)
You may take Benzonatate capsules for cough. Take 1 capsule by mouth three times daily as needed for cough.  Start Zyrtec daily for allergies.  Use the albuterol inhaler as needed for wheezing/shortness of breath.   Slowly increase activity levels.   Please message me in 1 week if no improvement.  It was a pleasure to see you today!

## 2018-04-24 ENCOUNTER — Telehealth: Payer: Self-pay

## 2018-04-24 NOTE — Telephone Encounter (Signed)
PLEASE NOTE: All timestamps contained within this report are represented as [place] Standard Time. CONFIDENTIALTY NOTICE: This fax transmission is intended only for the addressee. It contains information that is legally privileged, confidential or otherwise protected from use or disclosure. If you are not the intended recipient, you are strictly prohibited from reviewing, disclosing, copying using or disseminating any of this information or taking any action in reliance on or regarding this information. If you have received this fax in error, please notify us immediately by telephone so that we can arrange for its return to [place]. Phone: [phone], Toll-Free: [phone], Fax: [phone] Page: 1 of 2 Call Id: [id number] [place] Patient Name: Melinda Tucker Gender: Female DOB: [birth year] Age: 47 Y 18 D Return Phone Number: [phone] (Primary), [phone] (Secondary) [place] [address] Client [place] Night - Client Client Site [facility] [place] Physician Alma Friendly - NP Contact Type Call Who Is Calling Patient / Member / Family / Caregiver Call Type Triage / Clinical Relationship To Patient Self Return Phone Number [phone] (Primary) Chief Complaint Toe Pain Reason for Call Symptomatic / Request for [place] says that she took a nap, and when she woke up her big toe is really swollen and paniful Translation No Nurse Assessment Nurse: Thad Ranger, RN, Langley Gauss Date/Time (Eastern Time): 04/23/2018 9:47:40 PM Confirm and document reason for call. If symptomatic, describe symptoms. ---Caller says that she took a nap, and when she woke up her big toe is really red, swollen, and painful. Pedicure 2 days ago and the entire toe is red/swollen. Does the patient have any new or  worsening symptoms? ---Yes Will a triage be completed? ---Yes Related visit to physician within the last 2 weeks? ---No Does the PT have any chronic conditions? (i.e. diabetes, asthma, this includes High risk factors for pregnancy, etc.) ---No Is the patient pregnant or possibly pregnant? (Ask all females between the ages of 32-55) ---No Is this a behavioral health or substance abuse call? ---No Guidelines Guideline Title Affirmed Question Affirmed Notes Nurse Date/Time (Eastern Time) Toe Pain [1] SEVERE pain (e.g., excruciating, unable to do any normal activities) AND [2] not improved after 2 hours of pain medicine Carmon, RN, Langley Gauss 04/23/2018 9:48:35 PM Disp. Time Eilene Ghazi Time) Disposition Final User 04/23/2018 8:45:20 PM Send To RN Personal Vira Agar, RN, Seth Bake 04/23/2018 9:50:57 PM See HCP within 4 Hours (or PCP triage) Yes Carmon, RN, Langley Gauss PLEASE NOTE: All timestamps contained within this report are represented as [place] Standard Time. CONFIDENTIALTY NOTICE: This fax transmission is intended only for the addressee. It contains information that is legally privileged, confidential or otherwise protected from use or disclosure. If you are not the intended recipient, you are strictly prohibited from reviewing, disclosing, copying using or disseminating any of this information or taking any action in reliance on or regarding this information. If you have received this fax in error, please notify us immediately by telephone so that we can arrange for its return to [place]. Phone: [phone], Toll-Free: [phone], Fax: [phone] Page: 2 of 2 Call Id: [id number] [place] Disagree/Comply Comply Caller Understands Yes PreDisposition Go to ED Care Advice Given Per Guideline SEE HCP WITHIN 4 HOURS (OR PCP TRIAGE): PAIN MEDICINES: ACETAMINOPHEN (E.G., TYLENOL): * Another choice is to take 1,000 mg (two 500 mg pills) every 8 hours as needed. Each Extra Strength Tylenol pill has 500 mg  of  acetaminophen. The most you should take each day is 3,000 mg (6 Extra Strength pills a day). IBUPROFEN (E.G., MOTRIN, ADVIL): * Take 400 mg (two 200 mg pills) by mouth every 6 hours as needed. * Another choice is to take 600 mg (three 200 mg pills) by mouth every 8 hours as needed. CALL BACK IF: * You become worse. CARE ADVICE given per Toe Pain (Adult) guideline. Comments User: Romeo Apple, RN Date/Time Eilene Ghazi Time): 04/23/2018 9:50:36 PM Advised to keep foot elevated above heart. Referrals [facility] - ED

## 2018-04-24 NOTE — Telephone Encounter (Signed)
Unable to reach pt by phone for update. 

## 2018-04-25 NOTE — Telephone Encounter (Signed)
Noted  

## 2018-04-25 NOTE — Telephone Encounter (Signed)
FYI. Spoken to patient. She stated that she went to urgent care yesterday afternoon. Received Rx for an infection in her toe.

## 2018-05-30 ENCOUNTER — Other Ambulatory Visit: Payer: Self-pay | Admitting: Orthopedic Surgery

## 2018-05-30 DIAGNOSIS — G8929 Other chronic pain: Secondary | ICD-10-CM | POA: Diagnosis not present

## 2018-05-30 DIAGNOSIS — M25512 Pain in left shoulder: Principal | ICD-10-CM

## 2018-06-05 ENCOUNTER — Other Ambulatory Visit: Payer: Self-pay | Admitting: Orthopedic Surgery

## 2018-06-17 ENCOUNTER — Ambulatory Visit
Admission: RE | Admit: 2018-06-17 | Discharge: 2018-06-17 | Disposition: A | Discharge status: Home / Self Care | Payer: BLUE CROSS/BLUE SHIELD | Source: Ambulatory Visit | Attending: Orthopedic Surgery | Admitting: Orthopedic Surgery

## 2018-06-17 DIAGNOSIS — G8929 Other chronic pain: Secondary | ICD-10-CM

## 2018-06-17 DIAGNOSIS — M25512 Pain in left shoulder: Principal | ICD-10-CM

## 2018-06-17 DIAGNOSIS — R200 Anesthesia of skin: Secondary | ICD-10-CM | POA: Diagnosis not present

## 2018-06-21 DIAGNOSIS — M25512 Pain in left shoulder: Secondary | ICD-10-CM | POA: Diagnosis not present

## 2018-06-21 DIAGNOSIS — M7502 Adhesive capsulitis of left shoulder: Secondary | ICD-10-CM | POA: Diagnosis not present

## 2018-06-21 DIAGNOSIS — G8929 Other chronic pain: Secondary | ICD-10-CM | POA: Diagnosis not present

## 2018-11-17 ENCOUNTER — Encounter: Payer: Self-pay | Admitting: Primary Care

## 2018-11-17 ENCOUNTER — Other Ambulatory Visit: Payer: Self-pay

## 2018-11-17 ENCOUNTER — Ambulatory Visit (INDEPENDENT_AMBULATORY_CARE_PROVIDER_SITE_OTHER): Payer: BLUE CROSS/BLUE SHIELD | Admitting: Primary Care

## 2018-11-17 DIAGNOSIS — L237 Allergic contact dermatitis due to plants, except food: Secondary | ICD-10-CM | POA: Insufficient documentation

## 2018-11-17 HISTORY — DX: Allergic contact dermatitis due to plants, except food: L23.7

## 2018-11-17 MED ORDER — TRIAMCINOLONE ACETONIDE 0.1 % EX CREA: 1.0000 "application " | TOPICAL_CREAM | Freq: Two times a day (BID) | CUTANEOUS | 0 refills | Status: DC

## 2018-11-17 MED ORDER — PREDNISONE 20 MG PO TABS: ORAL_TABLET | ORAL | 0 refills | Status: DC

## 2018-11-17 NOTE — Assessment & Plan Note (Signed)
Consistent for poison ivy dermatitis via video visit. Rx for prednisone taper and triamcinolone cream sent to pharmacy. She will update if symptoms persist despite treatment.

## 2018-11-17 NOTE — Patient Instructions (Signed)
Start Prednisone 20 mg tablets. Take 2 tablets for four days, then 1 tablet for four days.   Use the triamcinolone cream twice daily as needed for itching/rash.  It was a pleasure to see you today!

## 2018-11-17 NOTE — Progress Notes (Signed)
Subjective:    Patient ID: Melinda Tucker, female    DOB: [birth year], 48 y.o.   MRN: [id number]  HPI  Virtual Visit via Video Note  I connected with Carollee Herter on 11/17/18 at  4:00 PM EDT by a video enabled telemedicine application and verified that I am speaking with the correct person using two identifiers.   I discussed the limitations of evaluation and management by telemedicine and the availability of in person appointments. The patient expressed understanding and agreed to proceed. She is at home, I am in the office.  History of Present Illness:  Melinda Tucker is a 48 year old female who presents today via video with a chief complaint of rash.  She's been out in her yard intermittently for the last several weeks, and yesterday she was in her yard cutting down bushes and encounter poison ivy. She noticed itching to her bilateral lower extremities last night when showering and then noticed rashes to her body this morning. Her rashes are located to the left lateral stomach, mid thoracic back, and left popliteal fossa. Her rash is very itchy. She's tried an OTC poison ivy spray without improvement.    Observations/Objective:  No distress. Speaking in complete sentences. Rashes with erythema, raised small bumps to mid thoracic back, left popliteal fossa, and left lower abdomen.   Assessment and Plan:  Consistent for poison ivy dermatitis via video visit. Rx for prednisone taper and triamcinolone cream sent to pharmacy. She will update if symptoms persist despite treatment.  Follow Up Instructions:  Start Prednisone 20 mg tablets. Take 2 tablets for four days, then 1 tablet for four days.   Use the triamcinolone cream twice daily as needed for itching/rash.  It was a pleasure to see you today!    I discussed the assessment and treatment plan with the patient. The patient was provided an opportunity to ask questions and all were answered. The patient agreed with the plan and  demonstrated an understanding of the instructions.   The patient was advised to call back or seek an in-person evaluation if the symptoms worsen or if the condition fails to improve as anticipated.     Pleas Koch, NP    Review of Systems  Constitutional: Negative for fever.  Skin: Positive for rash.       Past Medical History:  Diagnosis Date  . Allergy   . Heart murmur   . Hx of migraine headaches Started at age 19  . Rheumatic fever    At 15-years-ols  . Shingles 2016     Social History   Socioeconomic History  . Marital status: Married    Spouse name: Not on file  . Number of children: Not on file  . Years of education: Not on file  . Highest education level: Not on file  Occupational History  . Not on file  Social Needs  . Financial resource strain: Not on file  . Food insecurity:    Worry: Not on file    Inability: Not on file  . Transportation needs:    Medical: Not on file    Non-medical: Not on file  Tobacco Use  . Smoking status: Never Smoker  . Smokeless tobacco: Never Used  Substance and Sexual Activity  . Alcohol use: Not on file  . Drug use: Not on file  . Sexual activity: Not on file  Lifestyle  . Physical activity:    Days per week: Not on file  Minutes per session: Not on file  . Stress: Not on file  Relationships  . Social connections:    Talks on phone: Not on file    Gets together: Not on file    Attends religious service: Not on file    Active member of club or organization: Not on file    Attends meetings of clubs or organizations: Not on file    Relationship status: Not on file  . Intimate partner violence:    Fear of current or ex partner: Not on file    Emotionally abused: Not on file    Physically abused: Not on file    Forced sexual activity: Not on file  Other Topics Concern  . Not on file  Social History Narrative  . Not on file    Past Surgical History:  Procedure Laterality Date  . BREAST BIOPSY Left  1998  . CHOLECYSTECTOMY  2015  . COLONOSCOPY  2009  . TONSILLECTOMY  2014    Family History  Problem Relation Age of Onset  . Asthma Mother   . Depression Mother   . Hearing loss Mother   . Stroke Mother   . Arthritis Father   . Cancer Father   . Early death Father   . Hearing loss Father   . Hypertension Father   . Stroke Father   . Miscarriages / Stillbirths Sister   . Alcohol abuse Brother   . Mental illness Brother   . Learning disabilities Daughter   . Learning disabilities Son   . Arthritis Maternal Grandmother   . Cancer Maternal Grandmother   . Hearing loss Maternal Grandmother   . Heart attack Maternal Grandmother   . Kidney disease Maternal Grandmother   . Stroke Maternal Grandmother   . Alcohol abuse Maternal Grandfather   . Early death Maternal Grandfather   . Alcohol abuse Paternal Grandmother   . Arthritis Paternal Grandmother   . Cancer Paternal Grandmother   . COPD Paternal Grandmother   . Early death Paternal Grandmother   . Heart disease Paternal Grandmother   . Stroke Paternal Grandmother   . Alcohol abuse Paternal Grandfather   . Arthritis Paternal Grandfather   . Cancer Paternal Grandfather   . Early death Paternal Grandfather   . Hypertension Paternal Grandfather     Allergies  Allergen Reactions  . Codeine Anaphylaxis  . Shellfish Allergy Anaphylaxis  . Camphor Hives  . Meloxicam Other (See Comments)  . Phenol Hives  . Sulfa Antibiotics Rash    Current Outpatient Medications on File Prior to Visit  Medication Sig Dispense Refill  . meloxicam (MOBIC) 15 MG tablet Take 1 tablet (15 mg total) by mouth daily. 30 tablet 1  . rizatriptan (MAXALT) 10 MG tablet Take by mouth.    . fluticasone (FLONASE) 50 MCG/ACT nasal spray Place 2 sprays into both nostrils daily. (Patient not taking: Reported on 02/17/2018) 16 g 6   No current facility-administered medications on file prior to visit.     There were no vitals taken for this visit.    Objective:   Physical Exam  Constitutional: She is oriented to person, place, and time. She appears well-nourished.  Respiratory: Effort normal.  Neurological: She is alert and oriented to person, place, and time.  Skin: Skin is dry. Rash noted.  Rashes with erythema, raised small bumps to mid thoracic back, left popliteal fossa, and left lower abdomen.    Psychiatric: She has a normal mood and affect.  Assessment & Plan:

## 2019-03-15 DIAGNOSIS — F4310 Post-traumatic stress disorder, unspecified: Secondary | ICD-10-CM | POA: Diagnosis not present

## 2019-03-22 DIAGNOSIS — F4310 Post-traumatic stress disorder, unspecified: Secondary | ICD-10-CM | POA: Diagnosis not present

## 2019-03-28 DIAGNOSIS — F4310 Post-traumatic stress disorder, unspecified: Secondary | ICD-10-CM | POA: Diagnosis not present

## 2019-03-30 DIAGNOSIS — F4310 Post-traumatic stress disorder, unspecified: Secondary | ICD-10-CM | POA: Diagnosis not present

## 2019-04-04 DIAGNOSIS — F4310 Post-traumatic stress disorder, unspecified: Secondary | ICD-10-CM | POA: Diagnosis not present

## 2019-04-06 DIAGNOSIS — F4310 Post-traumatic stress disorder, unspecified: Secondary | ICD-10-CM | POA: Diagnosis not present

## 2019-04-11 DIAGNOSIS — F4310 Post-traumatic stress disorder, unspecified: Secondary | ICD-10-CM | POA: Diagnosis not present

## 2019-04-13 DIAGNOSIS — F4310 Post-traumatic stress disorder, unspecified: Secondary | ICD-10-CM | POA: Diagnosis not present

## 2019-05-02 DIAGNOSIS — F4310 Post-traumatic stress disorder, unspecified: Secondary | ICD-10-CM | POA: Diagnosis not present

## 2019-05-04 DIAGNOSIS — F4310 Post-traumatic stress disorder, unspecified: Secondary | ICD-10-CM | POA: Diagnosis not present

## 2019-05-09 DIAGNOSIS — F4310 Post-traumatic stress disorder, unspecified: Secondary | ICD-10-CM | POA: Diagnosis not present

## 2019-05-11 DIAGNOSIS — F4310 Post-traumatic stress disorder, unspecified: Secondary | ICD-10-CM | POA: Diagnosis not present

## 2019-05-16 DIAGNOSIS — F4310 Post-traumatic stress disorder, unspecified: Secondary | ICD-10-CM | POA: Diagnosis not present

## 2020-03-26 IMAGING — MR MR SHOULDER*L* W/O CM
4 of 5 series · 19 of 40 positions shown · non-contrast
Comparison: None.

CLINICAL DATA: Limited range of motion, numbness in the elbow to
the fingers for 3 months

EXAM:
MRI OF THE LEFT SHOULDER WITHOUT CONTRAST
TECHNIQUE: Multiplanar, multisequence MR imaging of the shoulder was performed.
No intravenous contrast was administered.

[Series 6: PD fat-sat · axial · left · 4.0mm · 0.44mm/px · z∈[-83,+5]mm · 6 of 20 slices shown (1 of 2)]
[im 1/20]
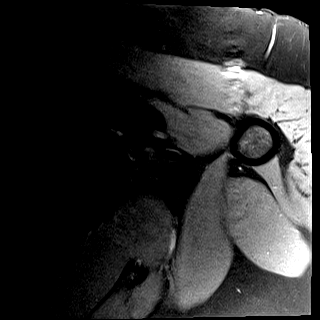
[im 4/20]
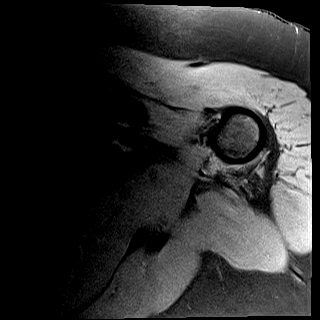
[im 8/20]
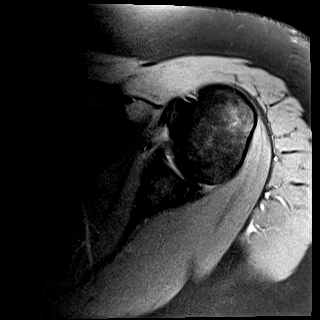
[im 12/20]
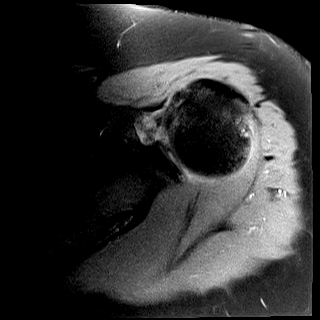
[im 16/20]
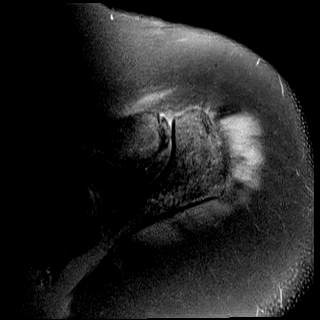
[im 20/20]
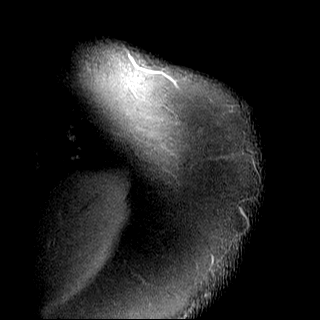

[Series 7: T2 fat-sat · oblique · left · 4.0mm · 0.44mm/px · 3 of 23 slices shown (1 of 2)]
[im 3/23]
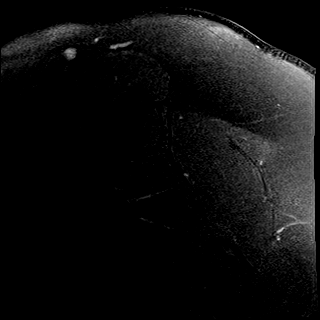
[im 12/23]
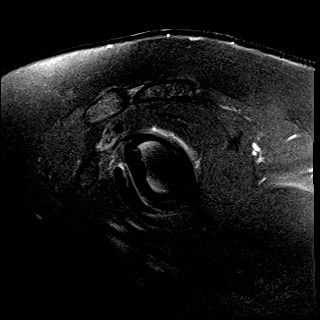
[im 20/23]
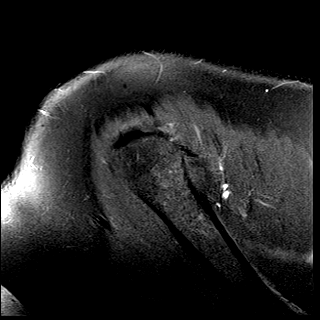

[Series 9: T2 fat-sat · oblique · left · 4.0mm · 0.22mm/px · 3 of 21 slices shown (2 of 2)]
[im 3/21]
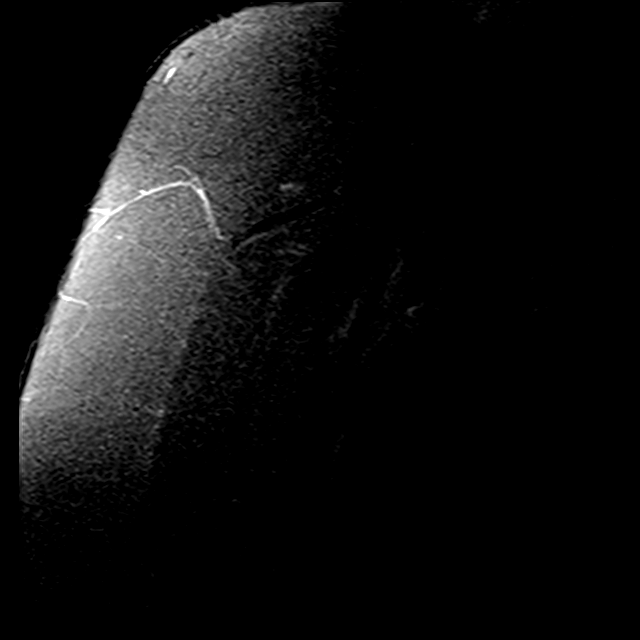
[im 12/21]
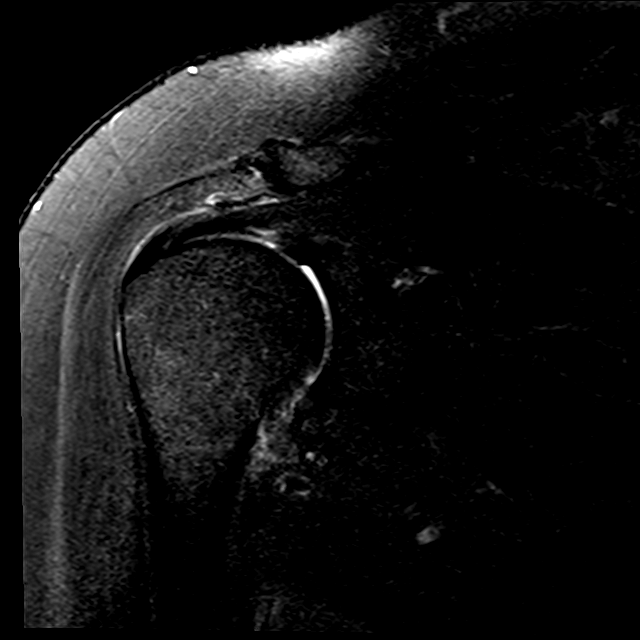
[im 18/21]
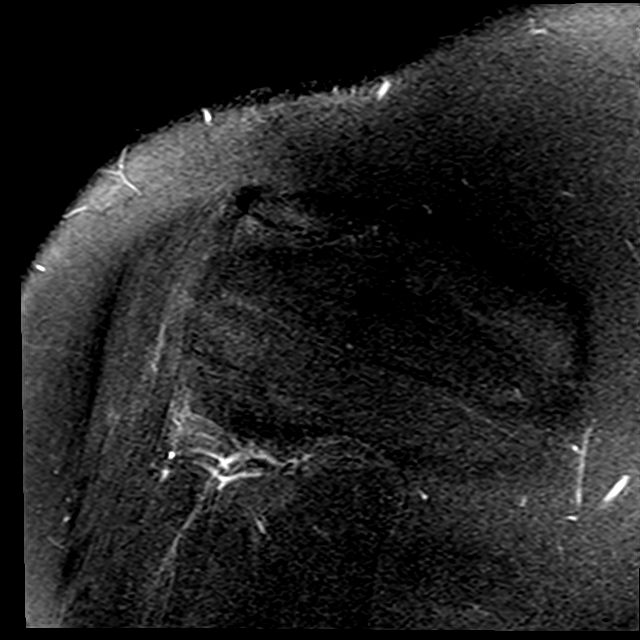

[Series 10: PD fat-sat · oblique · left · 4.0mm · 0.22mm/px · 7 of 21 slices shown (2 of 2)]
[im 1/21]
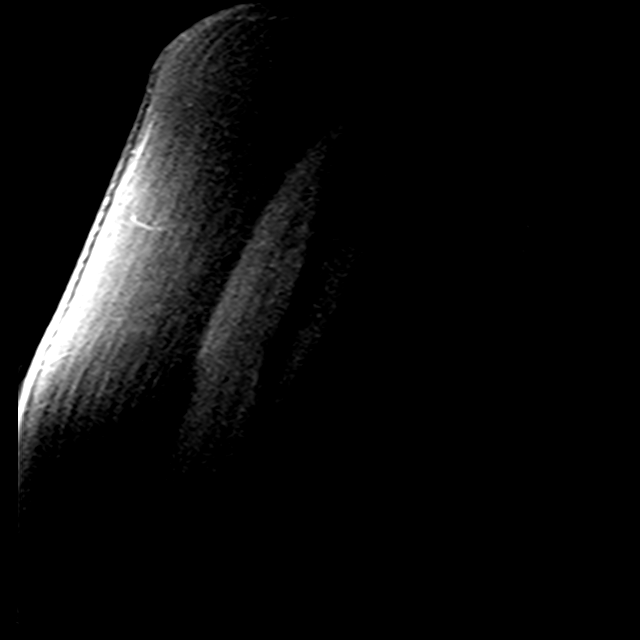
[im 3/21]
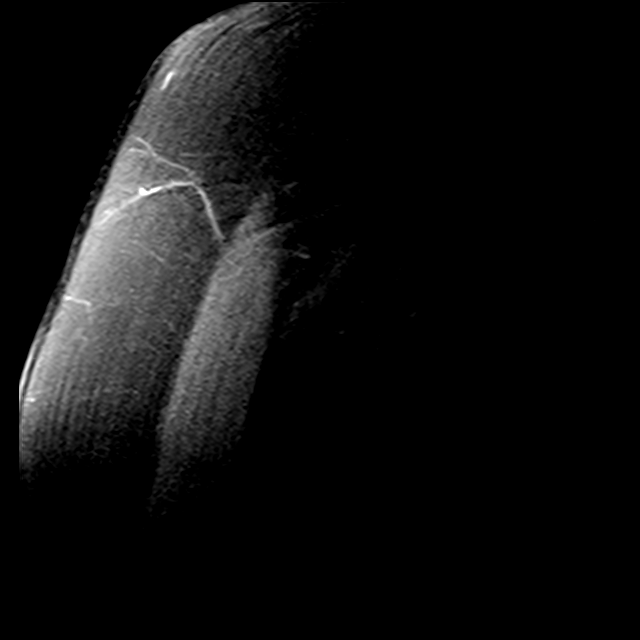
[im 6/21]
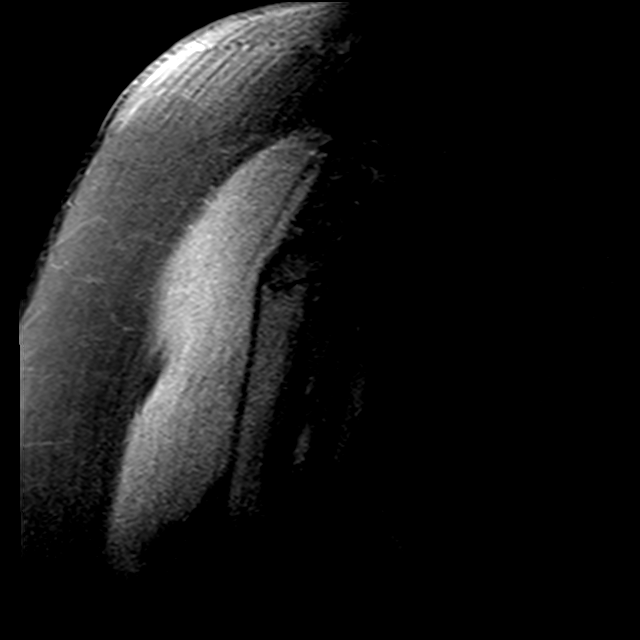
[im 9/21]
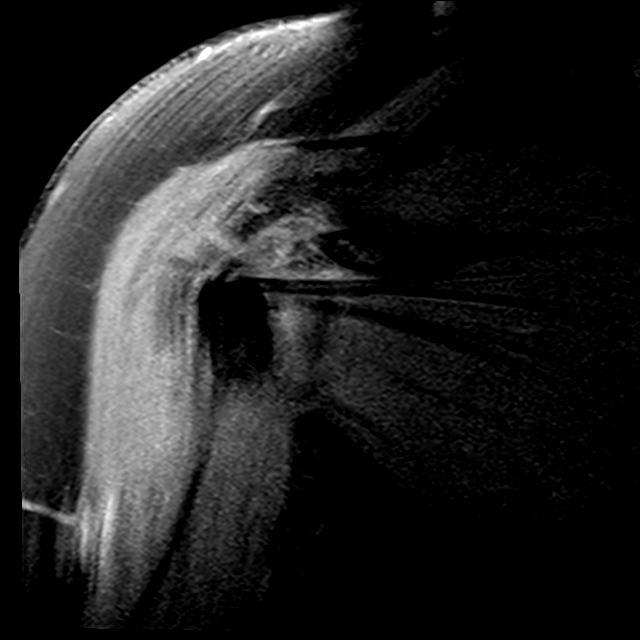
[im 12/21]
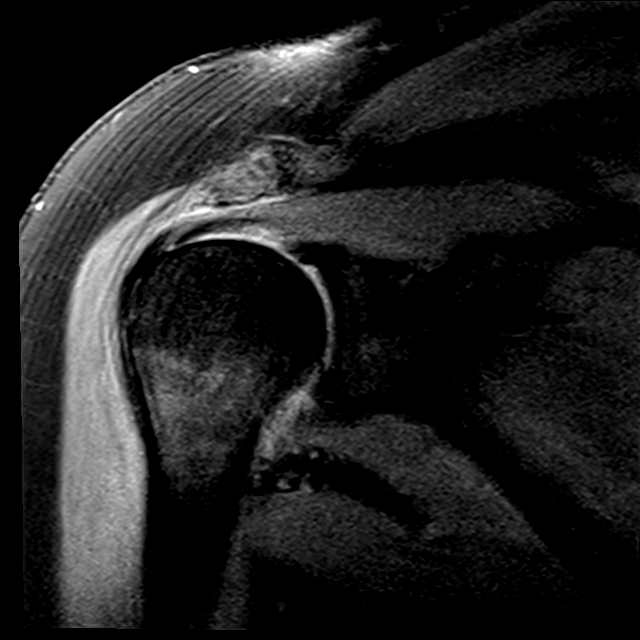
[im 15/21]
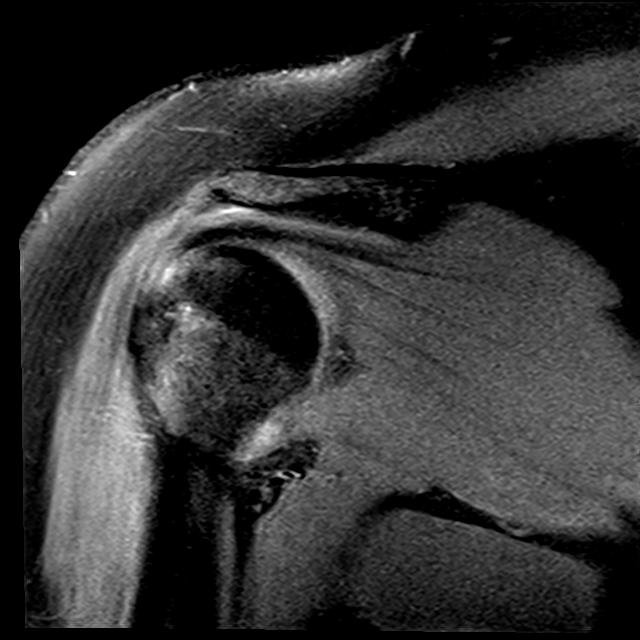
[im 18/21]
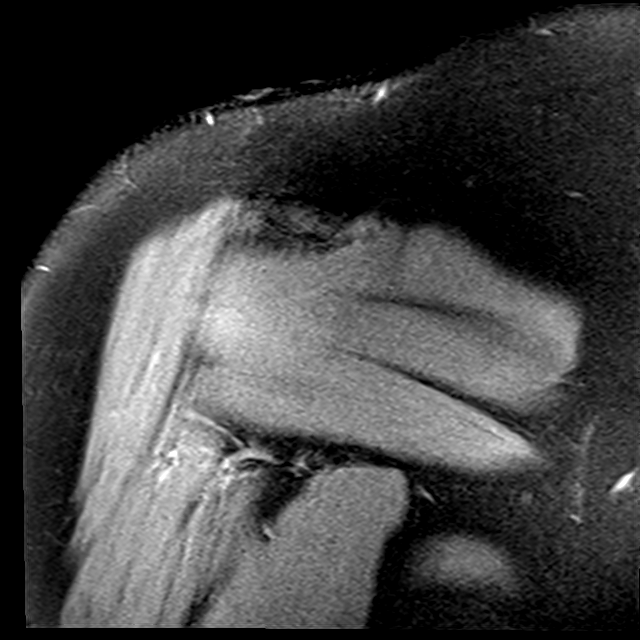

[19 of 40 positions shown; findings below may reference images not displayed]

FINDINGS: Rotator cuff: Mild tendinosis of the supraspinatus tendon with
fraying along the bursal surface. Mild tendinosis of the
infraspinatus tendon without a tear. Teres minor tendon is intact.
Subscapularis tendon is intact.

Muscles: No atrophy or fatty replacement of nor abnormal signal
within, the muscles of the rotator cuff.

Biceps long head:  Intact.

Acromioclavicular Joint: Normal acromioclavicular joint. Type I
acromion. No subacromial/subdeltoid bursal fluid.

Glenohumeral Joint: No joint effusion. No chondral defect. Severe
thickening of the inferior glenohumeral joint capsule as can be seen
with adhesive capsulitis.

Labrum: Grossly intact, but evaluation is limited by lack of
intraarticular fluid.

Bones: No marrow abnormality, fracture or dislocation. Subcortical
reactive marrow changes at the infraspinatus insertion.

Other: No fluid collection or hematoma.
IMPRESSION: 1. Severe thickening of the inferior glenohumeral joint capsule as
can be seen with adhesive capsulitis.
2. Mild tendinosis of the supraspinatus tendon with fraying along
the bursal surface.
3. Mild tendinosis of the infraspinatus tendon without a tear.

## 2020-05-09 NOTE — Telephone Encounter (Signed)
Have called patient and made virtual app in Sat clinic. Reviewed red words if she starts having any swelling in mouth, face or throat. Any sob she will go to ED or call 911.

## 2020-05-10 ENCOUNTER — Encounter: Payer: Self-pay | Admitting: Family Medicine

## 2020-05-10 ENCOUNTER — Telehealth (INDEPENDENT_AMBULATORY_CARE_PROVIDER_SITE_OTHER): Payer: No Typology Code available for payment source | Admitting: Family Medicine

## 2020-05-10 DIAGNOSIS — L509 Urticaria, unspecified: Secondary | ICD-10-CM

## 2020-05-10 NOTE — Progress Notes (Signed)
Virtual Visit via Video Note  I connected with Melinda Tucker on 05/10/20 at  9:30 AM EDT by a video enabled telemedicine application and verified that I am speaking with the correct person using two identifiers.  Location: Patient: In her home Provider: [place] Persons participating in virtual visit: Patient, provider   I discussed the limitations of evaluation and management by telemedicine and the availability of in person appointments. The patient expressed understanding and agreed to proceed.  History of Present Illness: Chief Complaint  Patient presents with  . Rash    x3 days ago sitting in [employer] and notice hands started turning red, then shortly after notice redness and whelps  all over body. Pt took benadryl and redness and whelps went away but shortly after came back and all over pt's body.  Pt states finger and toes are swollen. Pt states when she wash her hands she have a burning sensation.  This is a 49 year old female who presents today for acute video visit for above chief complaint.  She has had an intermittent rash for the last 3 days.  She reports having sensitive skin and multiple medication sensitivities that cause hives.  She has been recently using some over-the-counter Monistat vaginal cream, no other new medications, soaps, shampoo, laundry detergent, lotion, etc. She has been taking intermittent diphenhydramine with improvement of rash.  She is noticed raised red small to large areas on her hands, arms, legs, trunk.  She denies any lip, facial, tongue swelling. She reports that her symptoms are very mild currently without obvious lesions. She has a history of seasonal allergies and takes Zyrtec for these.  She has not been recently taking any long-acting antihistamine.   Observations/Objective: Patient is alert and answers questions appropriately.  Visible skin is unremarkable but video quality is not high resolution.  Respirations are even and  unlabored without increased work of breathing, normally conversive, no audible wheeze or witnessed cough.  Mood and affect are appropriate. There were no vitals taken for this visit. BP Readings from Last 3 Encounters:  02/17/18 120/70  11/11/17 122/72  05/11/17 126/84   Wt Readings from Last 3 Encounters:  02/17/18 242 lb 4 oz (109.9 kg)  11/11/17 241 lb (109.3 kg)  05/11/17 248 lb 12.8 oz (112.9 kg)    Assessment and Plan: 1. Hives - Provided written and verbal information regarding diagnosis and treatment. -Sent patient a MyChart message with information regarding hives and to recap visit -Discussed nature of hives and difficulty determining cause.  Advised her to start over-the-counter long-acting antihistamine and continue for 7 to 10 days, can add over-the-counter Tagamet if no improvement in a couple of days.  Can continue nighttime diphenhydramine use. -Discussed expectations of treatment and follow-up precautions. -If worsening or no improvement in 1 to 2 weeks, she can contact me or her PCP for dermatology referral  Clarene Reamer, FNP-BC  [facility], [place]  05/10/2020 9:46 AM   Follow Up Instructions: See above   I discussed the assessment and treatment plan with the patient. The patient was provided an opportunity to ask questions and all were answered. The patient agreed with the plan and demonstrated an understanding of the instructions.   The patient was advised to call back or seek an in-person evaluation if the symptoms worsen or if the condition fails to improve as anticipated.   Elby Beck, FNP

## 2021-04-20 DIAGNOSIS — Z0189 Encounter for other specified special examinations: Secondary | ICD-10-CM | POA: Diagnosis not present

## 2021-04-21 DIAGNOSIS — R748 Abnormal levels of other serum enzymes: Secondary | ICD-10-CM | POA: Diagnosis not present

## 2021-04-21 DIAGNOSIS — Z043 Encounter for examination and observation following other accident: Secondary | ICD-10-CM | POA: Diagnosis not present

## 2021-04-21 DIAGNOSIS — Z713 Dietary counseling and surveillance: Secondary | ICD-10-CM | POA: Diagnosis not present

## 2021-07-09 DIAGNOSIS — H6982 Other specified disorders of Eustachian tube, left ear: Secondary | ICD-10-CM | POA: Diagnosis not present

## 2021-07-09 DIAGNOSIS — R42 Dizziness and giddiness: Secondary | ICD-10-CM | POA: Diagnosis not present

## 2022-01-12 DIAGNOSIS — H109 Unspecified conjunctivitis: Secondary | ICD-10-CM | POA: Diagnosis not present

## 2022-04-27 DIAGNOSIS — Z0189 Encounter for other specified special examinations: Secondary | ICD-10-CM | POA: Diagnosis not present

## 2022-04-30 DIAGNOSIS — Z043 Encounter for examination and observation following other accident: Secondary | ICD-10-CM | POA: Diagnosis not present

## 2022-04-30 DIAGNOSIS — Z713 Dietary counseling and surveillance: Secondary | ICD-10-CM | POA: Diagnosis not present

## 2023-02-15 DIAGNOSIS — F409 Phobic anxiety disorder, unspecified: Secondary | ICD-10-CM | POA: Diagnosis not present

## 2023-03-18 ENCOUNTER — Ambulatory Visit (INDEPENDENT_AMBULATORY_CARE_PROVIDER_SITE_OTHER): Payer: BC Managed Care – PPO | Admitting: Primary Care

## 2023-03-18 ENCOUNTER — Encounter: Payer: Self-pay | Admitting: Primary Care

## 2023-03-18 VITALS — BP 124/72 | HR 75 | Temp 98.1°F | Ht 63.5 in | Wt 261.0 lb

## 2023-03-18 DIAGNOSIS — G43009 Migraine without aura, not intractable, without status migrainosus: Secondary | ICD-10-CM | POA: Diagnosis not present

## 2023-03-18 DIAGNOSIS — Z23 Encounter for immunization: Secondary | ICD-10-CM

## 2023-03-18 DIAGNOSIS — B079 Viral wart, unspecified: Secondary | ICD-10-CM

## 2023-03-18 DIAGNOSIS — R002 Palpitations: Secondary | ICD-10-CM | POA: Insufficient documentation

## 2023-03-18 DIAGNOSIS — Z1211 Encounter for screening for malignant neoplasm of colon: Secondary | ICD-10-CM

## 2023-03-18 DIAGNOSIS — Z1231 Encounter for screening mammogram for malignant neoplasm of breast: Secondary | ICD-10-CM

## 2023-03-18 DIAGNOSIS — Z0001 Encounter for general adult medical examination with abnormal findings: Secondary | ICD-10-CM

## 2023-03-18 DIAGNOSIS — Z0000 Encounter for general adult medical examination without abnormal findings: Secondary | ICD-10-CM

## 2023-03-18 LAB — COMPREHENSIVE METABOLIC PANEL
ALT: 37 U/L — ABNORMAL HIGH (ref 0–35)
AST: 26 U/L (ref 0–37)
Albumin: 4.4 g/dL (ref 3.5–5.2)
Alkaline Phosphatase: 90 U/L (ref 39–117)
BUN: 11 mg/dL (ref 6–23)
CO2: 27 meq/L (ref 19–32)
Calcium: 9.8 mg/dL (ref 8.4–10.5)
Chloride: 100 meq/L (ref 96–112)
Creatinine, Ser: 0.78 mg/dL (ref 0.40–1.20)
GFR: 86.4 mL/min (ref >60.00)
Glucose, Bld: 100 mg/dL — ABNORMAL HIGH (ref 70–99)
Potassium: 3.9 meq/L (ref 3.5–5.1)
Sodium: 135 meq/L (ref 135–145)
Total Bilirubin: 0.8 mg/dL (ref 0.2–1.2)
Total Protein: 7.1 g/dL (ref 6.0–8.3)

## 2023-03-18 LAB — CBC
HCT: 46.2 % — ABNORMAL HIGH (ref 36.0–46.0)
Hemoglobin: 15.1 g/dL — ABNORMAL HIGH (ref 12.0–15.0)
MCHC: 32.6 g/dL (ref 30.0–36.0)
MCV: 90.1 fl (ref 78.0–100.0)
Platelets: 302 10*3/uL (ref 150.0–400.0)
RBC: 5.13 Mil/uL — ABNORMAL HIGH (ref 3.87–5.11)
RDW: 14.2 % (ref 11.5–15.5)
WBC: 8 10*3/uL (ref 4.0–10.5)

## 2023-03-18 LAB — LIPID PANEL
Cholesterol: 177 mg/dL (ref 0–200)
HDL: 46.9 mg/dL (ref >39.00)
LDL Cholesterol: 107 mg/dL — ABNORMAL HIGH (ref 0–99)
NonHDL: 130.2
Total CHOL/HDL Ratio: 4
Triglycerides: 115 mg/dL (ref 0.0–149.0)
VLDL: 23 mg/dL (ref 0.0–40.0)

## 2023-03-18 LAB — TSH: TSH: 1.53 u[IU]/mL (ref 0.35–5.50)

## 2023-03-18 NOTE — Assessment & Plan Note (Addendum)
Patient requesting removal. Verbal consent obtained for cryotherapy.  Cryotherapy completed today. Patient tolerated well.  Discussed return instructions if needed.

## 2023-03-18 NOTE — Assessment & Plan Note (Signed)
Checking labs today including CBC, TSH, CMP, lipids, A1c.  Referral placed to cardiology for further evaluation.

## 2023-03-18 NOTE — Assessment & Plan Note (Signed)
Tetanus and Shingrix due, she opts for tetanus today.  Discussed nurse visits for Shingrix vaccines. Mammogram due, orders placed. Colonoscopy UTD, due referral placed to GI  Discussed the importance of a healthy diet and regular exercise in order for weight loss, and to reduce the risk of further co-morbidity.  Exam stable. Labs pending.  Follow up in 1 year for repeat physical.

## 2023-03-18 NOTE — Progress Notes (Addendum)
Subjective:    Patient ID: Melinda Tucker, female    DOB: [birth year], 52 y.o.   MRN: [id number]  HPI  Melinda Tucker is a very pleasant 52 y.o. female who presents today for complete physical and follow up of chronic conditions.  She has not been seen by me since April 2020.  She would also like to discuss skin lesion. Acute for the last 3 weeks and located to the right 3rd digit to the medial DIP joint. She denies pain. She's tried OTC treatment for 3 weeks without improvement. She would like this frozen off.  She would also like to discuss heart fluttering sensation. Acute for the 6 weeks, intermittent, lasts for a few minutes. She denies nausea, diaphoresis, dizziness, anxiety. She notices her fluttering sensation with activity, improved with rest.   Immunizations: -Tetanus: Completed in 2012, due today -Shingles: Never completed  Diet: Fair diet.  Exercise: No regular exercise.  Eye exam: Completes annually  Dental exam: Completes semi-annually    Pap Smear: Hysterectomy  Mammogram: Completed years ago  Colonoscopy: Completed nearly 20 years ago.   BP Readings from Last 3 Encounters:  03/18/23 124/72  02/17/18 120/70  11/11/17 122/72       Review of Systems  Constitutional:  Negative for unexpected weight change.  HENT:  Negative for rhinorrhea.   Respiratory:  Negative for cough and shortness of breath.   Cardiovascular:  Negative for chest pain.  Gastrointestinal:  Negative for constipation and diarrhea.  Genitourinary:  Negative for difficulty urinating.  Musculoskeletal:  Negative for arthralgias and myalgias.  Skin:  Negative for rash.  Allergic/Immunologic: Negative for environmental allergies.  Neurological:  Negative for dizziness and headaches.  Psychiatric/Behavioral:  The patient is not nervous/anxious.          Past Medical History:  Diagnosis Date   Allergy    Chronic foot pain, right 11/11/2017   Heart murmur    Hx of migraine headaches  Started at age 2   Poison ivy dermatitis 11/17/2018   Rheumatic fever    At 15-years-ols   Shingles 2016    Social History   Socioeconomic History   Marital status: Married    Spouse name: Not on file   Number of children: Not on file   Years of education: Not on file   Highest education level: Not on file  Occupational History   Not on file  Tobacco Use   Smoking status: Never   Smokeless tobacco: Never  Substance and Sexual Activity   Alcohol use: Not on file   Drug use: Not on file   Sexual activity: Not on file  Other Topics Concern   Not on file  Social History Narrative   Not on file   Social Determinants of Health   Financial Resource Strain: Not on file  Food Insecurity: Not on file  Transportation Needs: Not on file  Physical Activity: Not on file  Stress: Not on file  Social Connections: Not on file  Intimate Partner Violence: Not on file    Past Surgical History:  Procedure Laterality Date   BREAST BIOPSY Left 1998   CHOLECYSTECTOMY  2015   COLONOSCOPY  2009   TONSILLECTOMY  2014    Family History  Problem Relation Age of Onset   Asthma Mother    Depression Mother    Hearing loss Mother    Stroke Mother    Arthritis Father    Colon cancer Father    Early death Father  63   Hearing loss Father    Hypertension Father    Stroke Father    Miscarriages / [place] Sister    Alcohol abuse Brother    Mental illness Brother    Arthritis Maternal Grandmother    Cancer Maternal Grandmother    Hearing loss Maternal Grandmother    Heart attack Maternal Grandmother    Kidney disease Maternal Grandmother    Stroke Maternal Grandmother    Alcohol abuse Maternal Grandfather    Early death Maternal Grandfather    Alcohol abuse Paternal Grandmother    Arthritis Paternal Grandmother    Cancer Paternal Grandmother    COPD Paternal Grandmother    Early death Paternal Grandmother    Heart disease Paternal Grandmother    Stroke Paternal Grandmother     Alcohol abuse Paternal Grandfather    Arthritis Paternal Grandfather    Cancer Paternal Grandfather    Early death Paternal Grandfather    Hypertension Paternal Grandfather    Learning disabilities Daughter    Learning disabilities Son     Allergies  Allergen Reactions   Codeine Anaphylaxis   Shellfish Allergy Anaphylaxis   Camphor Hives   Meloxicam Other (See Comments)   Phenol Hives   Sulfa Antibiotics Rash    Current Outpatient Medications on File Prior to Visit  Medication Sig Dispense Refill   fluticasone (FLONASE) 50 MCG/ACT nasal spray Place 2 sprays into both nostrils daily. 16 g 6   rizatriptan (MAXALT) 10 MG tablet Take by mouth.     No current facility-administered medications on file prior to visit.    BP 124/72 (BP Location: Left Arm, Patient Position: Sitting, Cuff Size: Normal)   Pulse 75   Temp 98.1 F (36.7 C) (Oral)   Ht 5' 3.5" (1.613 m)   Wt 261 lb (118.4 kg)   SpO2 95%   BMI 45.51 kg/m  Objective:   Physical Exam HENT:     Right Ear: Tympanic membrane and ear canal normal.     Left Ear: Tympanic membrane and ear canal normal.     Nose: Nose normal.  Eyes:     Conjunctiva/sclera: Conjunctivae normal.     Pupils: Pupils are equal, round, and reactive to light.  Neck:     Thyroid: No thyromegaly.  Cardiovascular:     Rate and Rhythm: Normal rate and regular rhythm.     Heart sounds: No murmur heard. Pulmonary:     Effort: Pulmonary effort is normal.     Breath sounds: Normal breath sounds. No rales.  Abdominal:     General: Bowel sounds are normal.     Palpations: Abdomen is soft.     Tenderness: There is no abdominal tenderness.  Musculoskeletal:        General: Normal range of motion.     Cervical back: Neck supple.  Lymphadenopathy:     Cervical: No cervical adenopathy.  Skin:    General: Skin is warm and dry.     Findings: No rash.  Neurological:     Mental Status: She is alert and oriented to person, place, and time.      Cranial Nerves: No cranial nerve deficit.     Deep Tendon Reflexes: Reflexes are normal and symmetric.  Psychiatric:        Mood and Affect: Mood normal.           Assessment & Plan:  Encounter for annual general medical examination with abnormal findings in adult Assessment & Plan: Tetanus and Shingrix due,  she opts for tetanus today.  Discussed nurse visits for Shingrix vaccines. Mammogram due, orders placed. Colonoscopy UTD, due referral placed to GI  Discussed the importance of a healthy diet and regular exercise in order for weight loss, and to reduce the risk of further co-morbidity.  Exam stable. Labs pending.  Follow up in 1 year for repeat physical.    Screening mammogram for breast cancer -     3D Screening Mammogram, Left and Right; Future  Screening for colon cancer -     Ambulatory referral to Gastroenterology  Migraine without aura and without status migrainosus, not intractable Assessment & Plan: Controlled.  Continue Maxalt 10 mg PRN for which she uses sparingly   Fluttering sensation of heart Assessment & Plan: Checking labs today including CBC, TSH, CMP, lipids, A1c.  Referral placed to cardiology for further evaluation.  Orders: -     TSH -     Lipid panel -     Comprehensive metabolic panel -     CBC -     Hemoglobin A1c -     Ambulatory referral to Cardiology  Viral wart on finger Assessment & Plan: Patient requesting removal. Verbal consent obtained for cryotherapy.  Cryotherapy completed today. Patient tolerated well.  Discussed return instructions if needed.         Doreene Nest, NP

## 2023-03-18 NOTE — Addendum Note (Signed)
Addended by: Aundra Millet on: 03/18/2023 11:45 AM   Modules accepted: Orders

## 2023-03-18 NOTE — Patient Instructions (Signed)
You will either be contacted via phone regarding your referral to cardiology and GI, or you may receive a letter on your MyChart portal from our referral team with instructions for scheduling an appointment. Please let us know if you have not been contacted by anyone within two weeks.  Stop by the lab prior to leaving today. I will notify you of your results once received.   [facility].   It was a pleasure to see you today!

## 2023-03-18 NOTE — Assessment & Plan Note (Signed)
Controlled.  Continue Maxalt 10 mg PRN for which she uses sparingly

## 2023-03-21 LAB — HEMOGLOBIN A1C: Hgb A1c MFr Bld: 5.4 % (ref 4.6–6.5)

## 2023-03-24 DIAGNOSIS — R7989 Other specified abnormal findings of blood chemistry: Secondary | ICD-10-CM

## 2023-03-31 ENCOUNTER — Encounter: Payer: Self-pay | Admitting: *Deleted

## 2023-04-13 ENCOUNTER — Ambulatory Visit
Admission: RE | Admit: 2023-04-13 | Discharge: 2023-04-13 | Disposition: A | Discharge status: Home / Self Care | Payer: BC Managed Care – PPO | Source: Ambulatory Visit | Attending: Primary Care | Admitting: Primary Care

## 2023-04-13 DIAGNOSIS — Z1231 Encounter for screening mammogram for malignant neoplasm of breast: Secondary | ICD-10-CM | POA: Insufficient documentation

## 2023-09-12 ENCOUNTER — Telehealth: Payer: BC Managed Care – PPO | Admitting: Physician Assistant

## 2023-09-12 DIAGNOSIS — J029 Acute pharyngitis, unspecified: Secondary | ICD-10-CM | POA: Diagnosis not present

## 2023-09-12 DIAGNOSIS — Z20818 Contact with and (suspected) exposure to other bacterial communicable diseases: Secondary | ICD-10-CM | POA: Diagnosis not present

## 2023-09-12 MED ORDER — PENICILLIN V POTASSIUM 500 MG PO TABS: 500.0000 mg | ORAL_TABLET | Freq: Two times a day (BID) | ORAL | 0 refills | Status: AC

## 2023-09-12 NOTE — Progress Notes (Signed)

## 2024-01-05 DIAGNOSIS — R21 Rash and other nonspecific skin eruption: Secondary | ICD-10-CM | POA: Diagnosis not present

## 2024-04-15 ENCOUNTER — Telehealth: Admitting: Physician Assistant

## 2024-04-15 DIAGNOSIS — J208 Acute bronchitis due to other specified organisms: Secondary | ICD-10-CM | POA: Diagnosis not present

## 2024-04-15 DIAGNOSIS — B9689 Other specified bacterial agents as the cause of diseases classified elsewhere: Secondary | ICD-10-CM | POA: Diagnosis not present

## 2024-04-16 MED ORDER — AZITHROMYCIN 250 MG PO TABS: ORAL_TABLET | ORAL | 0 refills | Status: AC

## 2024-04-16 MED ORDER — BENZONATATE 100 MG PO CAPS: 100.0000 mg | ORAL_CAPSULE | Freq: Three times a day (TID) | ORAL | 0 refills | Status: AC | PRN

## 2024-04-16 NOTE — Progress Notes (Signed)
